# Patient Record
Sex: Male | Born: 1995 | Race: White | Hispanic: No | Marital: Single | Smoking: Never smoker
Health system: Southern US, Community
[De-identification: ages and names within clinical notes are randomized; demographics above are authoritative.]

## PROBLEM LIST (undated history)

## (undated) DIAGNOSIS — F909 Attention-deficit hyperactivity disorder, unspecified type: Secondary | ICD-10-CM

## (undated) HISTORY — PX: TONSILLECTOMY: SHX5217

## (undated) HISTORY — DX: Attention-deficit hyperactivity disorder, unspecified type: F90.9

---
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
## 2013-05-21 ENCOUNTER — Encounter: Payer: BC Managed Care – PPO | Admitting: Family Medicine

## 2013-06-10 ENCOUNTER — Encounter: Payer: Self-pay | Admitting: Family Medicine

## 2013-06-10 ENCOUNTER — Ambulatory Visit (INDEPENDENT_AMBULATORY_CARE_PROVIDER_SITE_OTHER): Payer: BC Managed Care – PPO | Admitting: Family Medicine

## 2013-06-10 VITALS — BP 92/64 | HR 59 | Temp 98.1°F | Ht 70.0 in | Wt 164.6 lb

## 2013-06-10 DIAGNOSIS — Z0000 Encounter for general adult medical examination without abnormal findings: Secondary | ICD-10-CM

## 2013-06-10 DIAGNOSIS — Z23 Encounter for immunization: Secondary | ICD-10-CM

## 2013-06-10 LAB — BASIC METABOLIC PANEL
BUN: 16 mg/dL (ref 6–23)
CO2: 30 mEq/L (ref 19–32)
Calcium: 9 mg/dL (ref 8.4–10.5)
Chloride: 104 mEq/L (ref 96–112)
Creatinine, Ser: 1.2 mg/dL (ref 0.4–1.5)
GFR: 84.4 mL/min (ref >60.00)
Glucose, Bld: 77 mg/dL (ref 70–99)
Potassium: 3.7 mEq/L (ref 3.5–5.1)
Sodium: 138 mEq/L (ref 135–145)

## 2013-06-10 LAB — LIPID PANEL
Cholesterol: 125 mg/dL (ref 0–200)
HDL: 57 mg/dL (ref >39.00)
LDL Cholesterol: 57 mg/dL (ref 0–99)
Total CHOL/HDL Ratio: 2
Triglycerides: 56 mg/dL (ref 0.0–149.0)
VLDL: 11.2 mg/dL (ref 0.0–40.0)

## 2013-06-10 LAB — CBC WITH DIFFERENTIAL/PLATELET
Basophils Absolute: 0 10*3/uL (ref 0.0–0.1)
Basophils Relative: 0.5 % (ref 0.0–3.0)
Eosinophils Absolute: 0.1 10*3/uL (ref 0.0–0.7)
Eosinophils Relative: 1.4 % (ref 0.0–5.0)
HCT: 44 % (ref 39.0–52.0)
Hemoglobin: 15.3 g/dL (ref 13.0–17.0)
Lymphocytes Relative: 40.2 % (ref 12.0–46.0)
Lymphs Abs: 1.8 10*3/uL (ref 0.7–4.0)
MCHC: 34.8 g/dL (ref 30.0–36.0)
MCV: 88.8 fl (ref 78.0–100.0)
Monocytes Absolute: 0.4 10*3/uL (ref 0.1–1.0)
Monocytes Relative: 9.3 % (ref 3.0–12.0)
Neutro Abs: 2.2 10*3/uL (ref 1.4–7.7)
Neutrophils Relative %: 48.6 % (ref 43.0–77.0)
Platelets: 230 10*3/uL (ref 150.0–400.0)
RBC: 4.96 Mil/uL (ref 4.22–5.81)
RDW: 12.4 % (ref 11.5–14.6)
WBC: 4.6 10*3/uL (ref 4.5–10.5)

## 2013-06-10 LAB — HEPATIC FUNCTION PANEL
ALT: 14 U/L (ref 0–53)
AST: 19 U/L (ref 0–37)
Albumin: 4.3 g/dL (ref 3.5–5.2)
Alkaline Phosphatase: 64 U/L (ref 39–117)
Bilirubin, Direct: 0.1 mg/dL (ref 0.0–0.3)
Total Bilirubin: 0.9 mg/dL (ref 0.3–1.2)
Total Protein: 6.6 g/dL (ref 6.0–8.3)

## 2013-06-10 LAB — TSH: TSH: 0.97 u[IU]/mL (ref 0.35–5.50)

## 2013-06-10 NOTE — Progress Notes (Signed)
Subjective:    Patient ID: Gregory Day, male    DOB: [birth year], 17 y.o.   MRN: [id number]  HPI Pt here for cpe and labs.   Review of Systems Review of Systems  Constitutional: Negative for activity change, appetite change and fatigue.  HENT: Negative for hearing loss, congestion, tinnitus and ear discharge.  dentist q32m Eyes: Negative for visual disturbance (see optho q1y -- vision corrected to 20/20 with glasses).  Respiratory: Negative for cough, chest tightness and shortness of breath.   Cardiovascular: Negative for chest pain, palpitations and leg swelling.  Gastrointestinal: Negative for abdominal pain, diarrhea, constipation and abdominal distention.  Genitourinary: Negative for urgency, frequency, decreased urine volume and difficulty urinating.  Musculoskeletal: Negative for back pain, arthralgias and gait problem.  Skin: Negative for color change, pallor and rash.  Neurological: Negative for dizziness, light-headedness, numbness and headaches.  Hematological: Negative for adenopathy. Does not bruise/bleed easily.  Psychiatric/Behavioral: Negative for suicidal ideas, confusion, sleep disturbance, self-injury, dysphoric mood, decreased concentration and agitation.     Past Medical History  Diagnosis Date  . ADHD (attention deficit hyperactivity disorder)    Family History  Problem Relation Age of Onset  . IgA nephropathy    . Hypertension Father   . Hyperlipidemia Son   . Hypertension Maternal Grandmother   . Heart disease Maternal Grandfather   . Depression Paternal Grandmother    History   Social History  . Marital Status: Single    Spouse Name: N/A    Number of Children: N/A  . Years of Education: N/A   Occupational History  . student    Social History Main Topics  . Smoking status: Never Smoker   . Smokeless tobacco: Not on file  . Alcohol Use: No  . Drug Use: No  . Sexual Activity: No   Other Topics Concern  . Not on file   Social History  Narrative  . No narrative on file   No current outpatient prescriptions on file prior to visit.   No current facility-administered medications on file prior to visit.       Objective:   Physical Exam BP 92/64  Pulse 59  Temp(Src) 98.1 F (36.7 C) (Oral)  Ht 5\' 10"  (1.778 m)  Wt 164 lb 9.6 oz (74.662 kg)  BMI 23.62 kg/m2  SpO2 96% General appearance: alert, cooperative, appears stated age and no distress Head: Normocephalic, without obvious abnormality, atraumatic Eyes: conjunctivae/corneas clear. PERRL, EOM's intact. Fundi benign. Ears: normal TM's and external ear canals both ears Nose: Nares normal. Septum midline. Mucosa normal. No drainage or sinus tenderness. Throat: lips, mucosa, and tongue normal; teeth and gums normal Neck: no adenopathy, no carotid bruit, no JVD, supple, symmetrical, trachea midline and thyroid not enlarged, symmetric, no tenderness/mass/nodules Back: symmetric, no curvature. ROM normal. No CVA tenderness. Lungs: clear to auscultation bilaterally Chest wall: no tenderness Heart: regular rate and rhythm, S1, S2 normal, no murmur, click, rub or gallop Abdomen: soft, non-tender; bowel sounds normal; no masses,  no organomegaly Male genitalia: penis: no lesions or discharge. testes: no masses or tenderness. no hernias Rectal: deferred Extremities: extremities normal, atraumatic, no cyanosis or edema Pulses: 2+ and symmetric Skin: Skin color, texture, turgor normal. No rashes or lesions Lymph nodes: Cervical, supraclavicular, and axillary nodes normal. Neurologic: Alert and oriented X 3, normal strength and tone. Normal symmetric reflexes. Normal coordination and gait Psych- no depression, no anxiety        Assessment & Plan:  cpe-- check labs  ghm utd     Flu shot given      See avs

## 2013-06-10 NOTE — Patient Instructions (Addendum)
Preventive Care for Adults, Male  A healthy lifestyle and preventive care can promote health and wellness. Preventive health guidelines for men include the following key practices:  · A routine yearly physical is a good way to check with your caregiver about your health and preventative screening. It is a chance to share any concerns and updates on your health, and to receive a thorough exam.  · Visit your dentist for a routine exam and preventative care every 6 months. Brush your teeth twice a day and floss once a day. Good oral hygiene prevents tooth decay and gum disease.  · The frequency of eye exams is based on your age, health, family medical history, use of contact lenses, and other factors. Follow your caregiver's recommendations for frequency of eye exams.  · Eat a healthy diet. Foods like vegetables, fruits, whole grains, low-fat dairy products, and lean protein foods contain the nutrients you need without too many calories. Decrease your intake of foods high in solid fats, added sugars, and salt. Eat the right amount of calories for you. Get information about a proper diet from your caregiver, if necessary.  · Regular physical exercise is one of the most important things you can do for your health. Most adults should get at least 150 minutes of moderate-intensity exercise (any activity that increases your heart rate and causes you to sweat) each week. In addition, most adults need muscle-strengthening exercises on 2 or more days a week.  · Maintain a healthy weight. The body mass index (BMI) is a screening tool to identify possible weight problems. It provides an estimate of body fat based on height and weight. Your caregiver can help determine your BMI, and can help you achieve or maintain a healthy weight. For adults 20 years and older:  · A BMI below 18.5 is considered underweight.  · A BMI of 18.5 to 24.9 is normal.  · A BMI of 25 to 29.9 is considered overweight.  · A BMI of 30 and above is  considered obese.  · Maintain normal blood lipids and cholesterol levels by exercising and minimizing your intake of saturated fat. Eat a balanced diet with plenty of fruit and vegetables. Blood tests for lipids and cholesterol should begin at age 20 and be repeated every 5 years. If your lipid or cholesterol levels are high, you are over 50, or you are a high risk for heart disease, you may need your cholesterol levels checked more frequently. Ongoing high lipid and cholesterol levels should be treated with medicines if diet and exercise are not effective.  · If you smoke, find out from your caregiver how to quit. If you do not use tobacco, do not start.  · If you choose to drink alcohol, do not exceed 2 drinks per day. One drink is considered to be 12 ounces (355 mL) of beer, 5 ounces (148 mL) of wine, or 1.5 ounces (44 mL) of liquor.  · Avoid use of street drugs. Do not share needles with anyone. Ask for help if you need support or instructions about stopping the use of drugs.  · High blood pressure causes heart disease and increases the risk of stroke. Your blood pressure should be checked at least every 1 to 2 years. Ongoing high blood pressure should be treated with medicines, if weight loss and exercise are not effective.  · If you are 45 to 19 years old, ask your caregiver if you should take aspirin to prevent heart disease.  · Diabetes screening involves taking   a blood sample to check your fasting blood sugar level. This should be done once every 3 years, after age 45, if you are within normal weight and without risk factors for diabetes. Testing should be considered at a younger age or be carried out more frequently if you are overweight and have at least 1 risk factor for diabetes.  · Colorectal cancer can be detected and often prevented. Most routine colorectal cancer screening begins at the age of 50 and continues through age 75. However, your caregiver may recommend screening at an earlier age if you  have risk factors for colon cancer. On a yearly basis, your caregiver may provide home test kits to check for hidden blood in the stool. Use of a small camera at the end of a tube, to directly examine the colon (sigmoidoscopy or colonoscopy), can detect the earliest forms of colorectal cancer. Talk to your caregiver about this at age 50, when routine screening begins.  Direct examination of the colon should be repeated every 5 to 10 years through age 75, unless early forms of pre-cancerous polyps or small growths are found.  · Hepatitis C blood testing is recommended for all people born from 1945 through 1965 and any individual with known risks for hepatitis C.  · Practice safe sex. Use condoms and avoid high-risk sexual practices to reduce the spread of sexually transmitted infections (STIs). STIs include gonorrhea, chlamydia, syphilis, trichomonas, herpes, HPV, and human immunodeficiency virus (HIV). Herpes, HIV, and HPV are viral illnesses that have no cure. They can result in disability, cancer, and death.  · A one-time screening for abdominal aortic aneurysm (AAA) and surgical repair of large AAAs by sound wave imaging (ultrasonography) is recommended for ages 65 to 75 years who are current or former smokers.  · Healthy men should no longer receive prostate-specific antigen (PSA) blood tests as part of routine cancer screening. Consult with your caregiver about prostate cancer screening.  · Testicular cancer screening is not recommended for adult males who have no symptoms. Screening includes self-exam, caregiver exam, and other screening tests. Consult with your caregiver about any symptoms you have or any concerns you have about testicular cancer.  · Use sunscreen with skin protection factor (SPF) of 30 or more. Apply sunscreen liberally and repeatedly throughout the day. You should seek shade when your shadow is shorter than you. Protect yourself by wearing long sleeves, pants, a wide-brimmed hat, and  sunglasses year round, whenever you are outdoors.  · Once a month, do a whole body skin exam, using a mirror to look at the skin on your back. Notify your caregiver of new moles, moles that have irregular borders, moles that are larger than a pencil eraser, or moles that have changed in shape or color.  · Stay current with required immunizations.  · Influenza. You need a dose every fall (or winter). The composition of the flu vaccine changes each year, so being vaccinated once is not enough.  · Pneumococcal polysaccharide. You need 1 to 2 doses if you smoke cigarettes or if you have certain chronic medical conditions. You need 1 dose at age 65 (or older) if you have never been vaccinated.  · Tetanus, diphtheria, pertussis (Tdap, Td). Get 1 dose of Tdap vaccine if you are younger than age 65 years, are over 65 and have contact with an infant, are a healthcare worker, or simply want to be protected from whooping cough. After that, you need a Td booster dose every 10 years. Consult your   caregiver if you have not had at least 3 tetanus and diphtheria-containing shots sometime in your life or have a deep or dirty wound.  · HPV. This vaccine is recommended for males 13 through 19 years of age. This vaccine may be given to men 22 through 19 years of age who have not completed the 3 dose series. It is recommended for men through age 26 who have sex with men or whose immune system is weakened because of HIV infection, other illness, or medications. The vaccine is given in 3 doses over 6 months.  · Measles, mumps, rubella (MMR). You need at least 1 dose of MMR if you were born in 1957 or later. You may also need a 2nd dose.  · Meningococcal. If you are age 19 to 21 years and a first-year college student living in a residence hall, or have one of several medical conditions, you need to get vaccinated against meningococcal disease. You may also need additional booster doses.  · Zoster (shingles). If you are age 60 years or  older, you should get this vaccine.  · Varicella (chickenpox). If you have never had chickenpox or you were vaccinated but received only 1 dose, talk to your caregiver to find out if you need this vaccine.  · Hepatitis A. You need this vaccine if you have a specific risk factor for hepatitis A virus infection, or you simply wish to be protected from this disease. The vaccine is usually given as 2 doses, 6 to 18 months apart.  · Hepatitis B. You need this vaccine if you have a specific risk factor for hepatitis B virus infection or you simply wish to be protected from this disease. The vaccine is given in 3 doses, usually over 6 months.  Preventative Service / Frequency  Ages 19 to 39  · Blood pressure check.** / Every 1 to 2 years.  · Lipid and cholesterol check.** / Every 5 years beginning at age 20.  · Hepatitis C blood test.** / For any individual with known risks for hepatitis C.  · Skin self-exam. / Monthly.  · Influenza immunization.** / Every year.  · Pneumococcal polysaccharide immunization.** / 1 to 2 doses if you smoke cigarettes or if you have certain chronic medical conditions.  · Tetanus, diphtheria, pertussis (Tdap,Td) immunization. / A one-time dose of Tdap vaccine. After that, you need a Td booster dose every 10 years.  · HPV immunization. / 3 doses over 6 months, if 26 and younger.  · Measles, mumps, rubella (MMR) immunization. / You need at least 1 dose of MMR if you were born in 1957 or later. You may also need a 2nd dose.  · Meningococcal immunization. / 1 dose if you are age 19 to 21 years and a first-year college student living in a residence hall, or have one of several medical conditions, you need to get vaccinated against meningococcal disease. You may also need additional booster doses.  · Varicella immunization.** / Consult your caregiver.  · Hepatitis A immunization.** / Consult your caregiver. 2 doses, 6 to 18 months apart.  · Hepatitis B immunization.** / Consult your caregiver. 3 doses  usually over 6 months.  Ages 40 to 64  · Blood pressure check.** / Every 1 to 2 years.  · Lipid and cholesterol check.** / Every 5 years beginning at age 20.  · Fecal occult blood test (FOBT) of stool. / Every year beginning at age 50 and continuing until age 75. You may not have   to do this test if you get colonoscopy every 10 years.  · Flexible sigmoidoscopy** or colonoscopy.** / Every 5 years for a flexible sigmoidoscopy or every 10 years for a colonoscopy beginning at age 50 and continuing until age 75.  · Hepatitis C blood test.** / For all people born from 1945 through 1965 and any individual with known risks for hepatitis C.  · Skin self-exam. / Monthly.  · Influenza immunization.** / Every year.  · Pneumococcal polysaccharide immunization.** / 1 to 2 doses if you smoke cigarettes or if you have certain chronic medical conditions.  · Tetanus, diphtheria, pertussis (Tdap/Td) immunization.** / A one-time dose of Tdap vaccine. After that, you need a Td booster dose every 10 years.  · Measles, mumps, rubella (MMR) immunization.  / You need at least 1 dose of MMR if you were born in 1957 or later. You may also need a 2nd dose.  · Varicella immunization.**/ Consult your caregiver.  · Meningococcal immunization.** / Consult your caregiver.  · Hepatitis A immunization.** / Consult your caregiver. 2 doses, 6 to 18 months apart.  · Hepatitis B immunization.** / Consult your caregiver. 3 doses, usually over 6 months.  Ages 65 and over  · Blood pressure check.** / Every 1 to 2 years.  · Lipid and cholesterol check.**/ Every 5 years beginning at age 20.  · Fecal occult blood test (FOBT) of stool. / Every year beginning at age 50 and continuing until age 75. You may not have to do this test if you get colonoscopy every 10 years.  · Flexible sigmoidoscopy** or colonoscopy.** / Every 5 years for a flexible sigmoidoscopy or every 10 years for a colonoscopy beginning at age 50 and continuing until age 75.  · Hepatitis C blood  test.** / For all people born from 1945 through 1965 and any individual with known risks for hepatitis C.  · Abdominal aortic aneurysm (AAA) screening.** / A one-time screening for ages 65 to 75 years who are current or former smokers.  · Skin self-exam. / Monthly.  · Influenza immunization.** / Every year.  · Pneumococcal polysaccharide immunization.** / 1 dose at age 65 (or older) if you have never been vaccinated.  · Tetanus, diphtheria, pertussis (Tdap, Td) immunization. / A one-time dose of Tdap vaccine if you are over 65 and have contact with an infant, are a healthcare worker, or simply want to be protected from whooping cough. After that, you need a Td booster dose every 10 years.  · Varicella immunization. ** / Consult your caregiver.  · Meningococcal immunization.** / Consult your caregiver.  · Hepatitis A immunization. ** / Consult your caregiver. 2 doses, 6 to 18 months apart.  · Hepatitis B immunization.** / Check with your caregiver. 3 doses, usually over 6 months.  **Family history and personal history of risk and conditions may change your caregiver's recommendations.  Document Released: 11/07/2001 Document Revised: 12/04/2011 Document Reviewed: 02/06/2011  [employer]® Patient Information ©2014 [employer].

## 2013-06-11 LAB — POCT URINALYSIS DIPSTICK
Bilirubin, UA: NEGATIVE
Blood, UA: NEGATIVE
Glucose, UA: NEGATIVE
Ketones, UA: NEGATIVE
Leukocytes, UA: NEGATIVE
Nitrite, UA: NEGATIVE
Protein, UA: NEGATIVE
Spec Grav, UA: <=1.005
Urobilinogen, UA: 0.2
pH, UA: 8

## 2013-09-30 ENCOUNTER — Encounter: Payer: Self-pay | Admitting: Family Medicine

## 2013-09-30 ENCOUNTER — Ambulatory Visit (INDEPENDENT_AMBULATORY_CARE_PROVIDER_SITE_OTHER): Payer: BC Managed Care – PPO | Admitting: Family Medicine

## 2013-09-30 VITALS — BP 134/62 | HR 84 | Temp 98.1°F | Ht 71.0 in | Wt 169.0 lb

## 2013-09-30 DIAGNOSIS — J329 Chronic sinusitis, unspecified: Secondary | ICD-10-CM

## 2013-09-30 MED ORDER — FLUTICASONE PROPIONATE 50 MCG/ACT NA SUSP: 2.0000 | Freq: Every day | NASAL | Status: DC

## 2013-09-30 MED ORDER — CEFUROXIME AXETIL 500 MG PO TABS
500.0000 mg | ORAL_TABLET | Freq: Two times a day (BID) | ORAL | Status: AC
Start: 2013-09-30 — End: 2013-10-10

## 2013-09-30 NOTE — Progress Notes (Signed)
  Subjective:     Gregory Day is a 18 y.o. male who presents for evaluation of sinus pain. Symptoms include: congestion, facial pain, headaches, nasal congestion and sinus pressure. Onset of symptoms was 1 week ago. Symptoms have been gradually worsening since that time. Past history is significant for no history of pneumonia or bronchitis. Patient is a non-smoker.  The following portions of the patient's history were reviewed and updated as appropriate: allergies, current medications, past family history, past medical history, past social history, past surgical history and problem list.  Review of Systems Pertinent items are noted in HPI.   Objective:    BP 134/62  Pulse 84  Temp(Src) 98.1 F (36.7 C) (Oral)  Ht 5\' 11"  (1.803 m)  Wt 169 lb (76.658 kg)  BMI 23.58 kg/m2  SpO2 97% General appearance: alert, cooperative, appears stated age and no distress Ears: normal TM's and external ear canals both ears Nose: Nares normal. Septum midline. Mucosa normal. No drainage or sinus tenderness., green discharge, moderate congestion, turbinates red, swollen, sinus tenderness bilateral Throat: lips, mucosa, and tongue normal; teeth and gums normal Neck: moderate anterior cervical adenopathy, supple, symmetrical, trachea midline and thyroid not enlarged, symmetric, no tenderness/mass/nodules Lungs: clear to auscultation bilaterally Heart: S1, S2 normal    Assessment:    Acute bacterial sinusitis.    Plan:    Nasal steroids per medication orders. Antihistamines per medication orders. Ceftin per medication orders.

## 2013-09-30 NOTE — Progress Notes (Signed)
Pre visit review using our clinic review tool, if applicable. No additional management support is needed unless otherwise documented below in the visit note. 

## 2013-09-30 NOTE — Patient Instructions (Signed)

## 2014-03-20 ENCOUNTER — Encounter (HOSPITAL_BASED_OUTPATIENT_CLINIC_OR_DEPARTMENT_OTHER): Payer: Self-pay | Admitting: Emergency Medicine

## 2014-03-20 ENCOUNTER — Emergency Department (HOSPITAL_BASED_OUTPATIENT_CLINIC_OR_DEPARTMENT_OTHER)
Admission: EM | Admit: 2014-03-20 | Discharge: 2014-03-21 | Disposition: A | Discharge status: Home / Self Care | Payer: BC Managed Care – PPO | Attending: Emergency Medicine | Admitting: Emergency Medicine

## 2014-03-20 DIAGNOSIS — L738 Other specified follicular disorders: Secondary | ICD-10-CM | POA: Insufficient documentation

## 2014-03-20 DIAGNOSIS — IMO0002 Reserved for concepts with insufficient information to code with codable children: Secondary | ICD-10-CM | POA: Insufficient documentation

## 2014-03-20 DIAGNOSIS — R509 Fever, unspecified: Secondary | ICD-10-CM | POA: Insufficient documentation

## 2014-03-20 DIAGNOSIS — Z8659 Personal history of other mental and behavioral disorders: Secondary | ICD-10-CM | POA: Insufficient documentation

## 2014-03-20 DIAGNOSIS — J029 Acute pharyngitis, unspecified: Secondary | ICD-10-CM

## 2014-03-20 LAB — RAPID STREP SCREEN (MED CTR MEBANE ONLY): Streptococcus, Group A Screen (Direct): NEGATIVE

## 2014-03-20 NOTE — ED Notes (Signed)
Pt reports temperature this am, with sore throat, took tylenol and felt better then this afternoon he developed a small rash to left arm

## 2014-03-20 NOTE — ED Notes (Signed)
MD at bedside. 

## 2014-03-20 NOTE — ED Provider Notes (Signed)
CSN: [id number]     Arrival date & time 03/20/14  2314 History   This chart was scribed for April Smitty CordsK Palumbo-Rasch, MD by Nicholos Johnsenise Iheanachor, ED scribe. This patient was seen in room MH10/MH10 and the patient's care was started at 11:47 PM.    Chief Complaint  Patient presents with  . Rash  . Sore Throat   Patient is a 18 y.o. male presenting with rash and pharyngitis. The history is provided by the patient. No language interpreter was used.  Rash Location:  Shoulder/arm, leg and torso Quality: itchiness   Severity:  Mild Onset quality:  Gradual Timing:  Constant Progression:  Resolved Chronicity:  New Context: food   Context: not insect bite/sting, not new detergent/soap and not sick contacts   Relieved by:  Nothing Worsened by:  Nothing tried Ineffective treatments:  None tried Associated symptoms: fever and sore throat   Associated symptoms: no abdominal pain, no diarrhea and not vomiting   Fever:    Timing:  Intermittent   Max temp PTA (F):  102   Progression:  Improving Sore throat:    Severity:  Mild   Onset quality:  Gradual   Timing:  Constant   Progression:  Unchanged Sore Throat Pertinent negatives include no abdominal pain.   HPI Comments: Gregory Day is a 18 y.o. male who presents to the Emergency Department complaining of sore throat; onset this morning. States he woke up with a fever of 102 and HA. Took ibuprofen this morning. Took Benadryl this evening. Reports tonight he broke out in a diffuse rash all over his body; gradually resolving. Does report drinking a red bull for the first time today. Reports no known insect bites. No reports of poison ivy. States he had some abdominal pain last night; has since resolved. No new soaps, lotions, detergent.  Past Medical History  Diagnosis Date  . ADHD (attention deficit hyperactivity disorder)    Past Surgical History  Procedure Laterality Date  . Tonsillectomy     Family History  Problem Relation Age of Onset   . IgA nephropathy    . Hypertension Father   . Hyperlipidemia Son   . Hypertension Maternal Grandmother   . Heart disease Maternal Grandfather   . Depression Paternal Grandmother    History  Substance Use Topics  . Smoking status: Never Smoker   . Smokeless tobacco: Not on file  . Alcohol Use: No    Review of Systems  Constitutional: Positive for fever.  HENT: Positive for sore throat. Negative for drooling, ear pain, facial swelling, trouble swallowing and voice change.   Gastrointestinal: Negative for vomiting, abdominal pain, diarrhea and constipation.  Skin: Positive for rash.  All other systems reviewed and are negative.  Allergies  Review of patient's allergies indicates no known allergies.  Home Medications   Prior to Admission medications   Medication Sig Start Date End Date Taking? Authorizing Provider  acetaminophen (TYLENOL) 325 MG tablet Take 650 mg by mouth every 6 (six) hours as needed.   Yes Historical Provider, MD  diphenhydrAMINE (SOMINEX) 25 MG tablet Take 25 mg by mouth at bedtime as needed for sleep.   Yes Historical Provider, MD  fluticasone (FLONASE) 50 MCG/ACT nasal spray Place 2 sprays into both nostrils daily. 09/30/13   Lelon PerlaYvonne R Lowne, DO   Triage vitals: BP 124/67  Pulse 101  Temp(Src) 99.4 F (37.4 C) (Oral)  Resp 18  Ht 5\' 11"  (1.803 m)  Wt 165 lb (74.844 kg)  BMI 23.02  kg/m2  SpO2 100%  Physical Exam  Nursing note and vitals reviewed. Constitutional: He is oriented to person, place, and time. He appears well-developed and well-nourished.  HENT:  Head: Normocephalic and atraumatic.  Right Ear: External ear normal.  Left Ear: External ear normal.  Mouth/Throat: Oropharynx is clear and moist.  Mallampati class 1. No coating of the tongue. Uvula midline.   Eyes: Conjunctivae and EOM are normal. Pupils are equal, round, and reactive to light.  Neck: Normal range of motion. Neck supple. No tracheal deviation present.  No pain with  displacement of the trachea,  Normal phonation  Cardiovascular: Normal rate, regular rhythm, normal heart sounds and intact distal pulses.   Pulmonary/Chest: Effort normal and breath sounds normal. No stridor. No respiratory distress. He has no wheezes. He has no rales.  Abdominal: Soft. Bowel sounds are normal. He exhibits no distension. There is no tenderness. There is no rebound and no guarding.  Musculoskeletal: Normal range of motion.  Lymphadenopathy:    He has no cervical adenopathy.  Neurological: He is alert and oriented to person, place, and time.  Skin: Skin is warm and dry.  Inflamed hair follicles.  Psychiatric: He has a normal mood and affect. Judgment normal.    ED Course  Procedures (including critical care time) DIAGNOSTIC STUDIES:   COORDINATION OF CARE: At 11:51 PM: Discussed treatment plan with patient which includes strep test and ibuprofen. Patient agrees.    Labs Review Labs Reviewed  RAPID STREP SCREEN   Imaging Review No results found.   EKG Interpretation None      MDM   Final diagnoses:  None   Rash no longer present suspect the energy drink as source.  Continue benadryl as needed for itching.  Viral pharyngitis will treat with ibuprofen   I personally performed the services described in this documentation, which was scribed in my presence. The recorded information has been reviewed and is accurate.     Jasmine AweApril K Palumbo-Rasch, MD 03/21/14 [phone]

## 2014-03-21 ENCOUNTER — Encounter (HOSPITAL_BASED_OUTPATIENT_CLINIC_OR_DEPARTMENT_OTHER): Payer: Self-pay | Admitting: Emergency Medicine

## 2014-03-21 LAB — MONONUCLEOSIS SCREEN: Mono Screen: NEGATIVE

## 2014-03-21 MED ORDER — IBUPROFEN 400 MG PO TABS: 400.0000 mg | ORAL_TABLET | Freq: Four times a day (QID) | ORAL | Status: DC | PRN

## 2014-03-21 MED ORDER — IBUPROFEN 400 MG PO TABS
400.0000 mg | ORAL_TABLET | Freq: Once | ORAL | Status: AC
  Administered 2014-03-21: 400 mg via ORAL
  Filled 2014-03-21: qty 1

## 2014-03-21 NOTE — ED Notes (Signed)
MD at bedside discussing dispo plan of care. 

## 2014-03-23 LAB — CULTURE, GROUP A STREP

## 2014-06-12 ENCOUNTER — Ambulatory Visit (INDEPENDENT_AMBULATORY_CARE_PROVIDER_SITE_OTHER): Payer: BC Managed Care – PPO | Admitting: Family Medicine

## 2014-06-12 ENCOUNTER — Encounter: Payer: Self-pay | Admitting: Family Medicine

## 2014-06-12 VITALS — BP 122/84 | HR 59 | Temp 97.9°F | Ht 70.0 in | Wt 168.8 lb

## 2014-06-12 DIAGNOSIS — R61 Generalized hyperhidrosis: Secondary | ICD-10-CM

## 2014-06-12 DIAGNOSIS — Z0000 Encounter for general adult medical examination without abnormal findings: Secondary | ICD-10-CM

## 2014-06-12 DIAGNOSIS — Z23 Encounter for immunization: Secondary | ICD-10-CM

## 2014-06-12 DIAGNOSIS — M25561 Pain in right knee: Secondary | ICD-10-CM

## 2014-06-12 DIAGNOSIS — L509 Urticaria, unspecified: Secondary | ICD-10-CM

## 2014-06-12 DIAGNOSIS — M25569 Pain in unspecified knee: Secondary | ICD-10-CM

## 2014-06-12 LAB — CBC WITH DIFFERENTIAL/PLATELET
Basophils Absolute: 0 10*3/uL (ref 0.0–0.1)
Basophils Relative: 0.4 % (ref 0.0–3.0)
EOS PCT: 1.9 % (ref 0.0–5.0)
Eosinophils Absolute: 0.1 10*3/uL (ref 0.0–0.7)
HCT: 45 % (ref 39.0–52.0)
Hemoglobin: 15.3 g/dL (ref 13.0–17.0)
LYMPHS PCT: 35.2 % (ref 12.0–46.0)
Lymphs Abs: 1.9 10*3/uL (ref 0.7–4.0)
MCHC: 34 g/dL (ref 30.0–36.0)
MCV: 90.5 fl (ref 78.0–100.0)
Monocytes Absolute: 0.5 10*3/uL (ref 0.1–1.0)
Monocytes Relative: 8.8 % (ref 3.0–12.0)
NEUTROS PCT: 53.7 % (ref 43.0–77.0)
Neutro Abs: 2.8 10*3/uL (ref 1.4–7.7)
Platelets: 232 10*3/uL (ref 150.0–400.0)
RBC: 4.97 Mil/uL (ref 4.22–5.81)
RDW: 12.2 % (ref 11.5–14.6)
WBC: 5.3 10*3/uL (ref 4.5–10.5)

## 2014-06-12 LAB — BASIC METABOLIC PANEL
BUN: 18 mg/dL (ref 6–23)
CO2: 28 mEq/L (ref 19–32)
Calcium: 9 mg/dL (ref 8.4–10.5)
Chloride: 104 mEq/L (ref 96–112)
Creatinine, Ser: 1.3 mg/dL (ref 0.4–1.5)
GFR: 78.15 mL/min (ref >60.00)
Glucose, Bld: 86 mg/dL (ref 70–99)
Potassium: 3.8 mEq/L (ref 3.5–5.1)
Sodium: 139 mEq/L (ref 135–145)

## 2014-06-12 LAB — HEPATIC FUNCTION PANEL
ALT: 22 U/L (ref 0–53)
AST: 24 U/L (ref 0–37)
Albumin: 4.3 g/dL (ref 3.5–5.2)
Alkaline Phosphatase: 60 U/L (ref 39–117)
BILIRUBIN DIRECT: 0.1 mg/dL (ref 0.0–0.3)
BILIRUBIN TOTAL: 1.1 mg/dL (ref 0.2–1.2)
Total Protein: 7.2 g/dL (ref 6.0–8.3)

## 2014-06-12 LAB — POCT URINALYSIS DIPSTICK
Bilirubin, UA: NEGATIVE
Blood, UA: NEGATIVE
Glucose, UA: NEGATIVE
Ketones, UA: NEGATIVE
LEUKOCYTES UA: NEGATIVE
Nitrite, UA: NEGATIVE
PH UA: 8
SPEC GRAV UA: 1.015
Urobilinogen, UA: 2

## 2014-06-12 LAB — LIPID PANEL
CHOL/HDL RATIO: 3
Cholesterol: 146 mg/dL (ref 0–200)
HDL: 58 mg/dL (ref >39.00)
LDL Cholesterol: 79 mg/dL (ref 0–99)
NonHDL: 88
Triglycerides: 44 mg/dL (ref 0.0–149.0)
VLDL: 8.8 mg/dL (ref 0.0–40.0)

## 2014-06-12 LAB — TSH: TSH: 0.84 u[IU]/mL (ref 0.35–5.50)

## 2014-06-12 MED ORDER — ALUMINUM CHLORIDE 20 % EX SOLN: Freq: Every day | CUTANEOUS | Status: DC

## 2014-06-12 NOTE — Progress Notes (Signed)
Subjective:    Patient ID: Gregory Day, male    DOB: 1995/11/21, 18 y.o.   MRN: [id number]  HPI Pt here with his mom for cpe-- he is in school and enjoys the culinary part of his studies.  Pt c/o knee pain--no injury He also c/o allergy symptoms and spontaneous hives.  No known cause.   Review of Systems  Constitutional: Negative.   HENT: Negative for congestion, ear pain, hearing loss, nosebleeds, postnasal drip, rhinorrhea, sinus pressure, sneezing and tinnitus.   Eyes: Negative for photophobia, discharge, itching and visual disturbance.  Respiratory: Negative.   Cardiovascular: Negative.   Gastrointestinal: Negative for abdominal pain, constipation, blood in stool, abdominal distention and anal bleeding.  Endocrine: Negative.   Genitourinary: Negative.   Musculoskeletal: Negative.   Skin: Negative.   Allergic/Immunologic: Negative.   Neurological: Negative for dizziness, weakness, light-headedness, numbness and headaches.  Psychiatric/Behavioral: Negative for suicidal ideas, confusion, sleep disturbance, dysphoric mood, decreased concentration and agitation. The patient is not nervous/anxious.    Past Medical History  Diagnosis Date  . ADHD (attention deficit hyperactivity disorder)    History   Social History  . Marital Status: Single    Spouse Name: N/A    Number of Children: N/A  . Years of Education: N/A   Occupational History  . student    Social History Main Topics  . Smoking status: Never Smoker   . Smokeless tobacco: Not on file  . Alcohol Use: No  . Drug Use: No  . Sexual Activity: No   Other Topics Concern  . Not on file   Social History Narrative  . No narrative on file   Current Outpatient Prescriptions  Medication Sig Dispense Refill  . aluminum chloride (DRYSOL) 20 % external solution Apply topically at bedtime.  35 mL  0   No current facility-administered medications for this visit.   No Known Allergies      Objective:   Physical  Exam  Nursing note and vitals reviewed. Constitutional: He is oriented to person, place, and time. He appears well-developed and well-nourished. No distress.  HENT:  Head: Normocephalic and atraumatic.  Right Ear: External ear normal.  Left Ear: External ear normal.  Nose: Nose normal.  Mouth/Throat: Oropharynx is clear and moist. No oropharyngeal exudate.  Eyes: Conjunctivae and EOM are normal. Pupils are equal, round, and reactive to light. Right eye exhibits no discharge. Left eye exhibits no discharge.  Neck: Normal range of motion. Neck supple. No JVD present. No thyromegaly present.  Cardiovascular: Normal rate, regular rhythm and intact distal pulses.  Exam reveals no gallop and no friction rub.   No murmur heard. Pulmonary/Chest: Effort normal and breath sounds normal. No respiratory distress. He has no wheezes. He has no rales. He exhibits no tenderness.  Abdominal: Soft. Bowel sounds are normal. He exhibits no distension and no mass. There is no tenderness. There is no rebound and no guarding.  Genitourinary: Penis normal.  Musculoskeletal: Normal range of motion. He exhibits no edema and no tenderness.  Lymphadenopathy:    He has no cervical adenopathy.  Neurological: He is alert and oriented to person, place, and time. He displays normal reflexes. He exhibits normal muscle tone.  Skin: Skin is warm and dry. No rash noted. He is not diaphoretic. No erythema. No pallor.  Psychiatric: He has a normal mood and affect. His behavior is normal. Judgment and thought content normal.          Assessment & Plan:  1.  Need for prophylactic vaccination and inoculation against influenza   - Flu Vaccine QUAD 36+ mos PF IM (Fluarix Quad PF)  2. Right knee pain   - Ambulatory referral to Sports Medicine  3. Hives Zyrtec 10 mg qd   - Ambulatory referral to Allergy  4. Excessive sweating   - aluminum chloride (DRYSOL) 20 % external solution; Apply topically at bedtime.   Dispense: 35 mL; Refill: 0  5. Preventative health care  ghm utd Check labs See AVS - Basic metabolic panel - CBC with Differential - Hepatic function panel - Lipid panel - POCT urinalysis dipstick - TSH

## 2014-06-12 NOTE — Patient Instructions (Signed)
Preventive Care for Adults A healthy lifestyle and preventive care can promote health and wellness. Preventive health guidelines for men include the following key practices:  A routine yearly physical is a good way to check with your health care provider about your health and preventative screening. It is a chance to share any concerns and updates on your health and to receive a thorough exam.  Visit your dentist for a routine exam and preventative care every 6 months. Brush your teeth twice a day and floss once a day. Good oral hygiene prevents tooth decay and gum disease.  The frequency of eye exams is based on your age, health, family medical history, use of contact lenses, and other factors. Follow your health care provider's recommendations for frequency of eye exams.  Eat a healthy diet. Foods such as vegetables, fruits, whole grains, low-fat dairy products, and lean protein foods contain the nutrients you need without too many calories. Decrease your intake of foods high in solid fats, added sugars, and salt. Eat the right amount of calories for you.Get information about a proper diet from your health care provider, if necessary.  Regular physical exercise is one of the most important things you can do for your health. Most adults should get at least 150 minutes of moderate-intensity exercise (any activity that increases your heart rate and causes you to sweat) each week. In addition, most adults need muscle-strengthening exercises on 2 or more days a week.  Maintain a healthy weight. The body mass index (BMI) is a screening tool to identify possible weight problems. It provides an estimate of body fat based on height and weight. Your health care provider can find your BMI and can help you achieve or maintain a healthy weight.For adults 20 years and older:  A BMI below 18.5 is considered underweight.  A BMI of 18.5 to 24.9 is normal.  A BMI of 25 to 29.9 is considered overweight.  A BMI  of 30 and above is considered obese.  Maintain normal blood lipids and cholesterol levels by exercising and minimizing your intake of saturated fat. Eat a balanced diet with plenty of fruit and vegetables. Blood tests for lipids and cholesterol should begin at age 50 and be repeated every 5 years. If your lipid or cholesterol levels are high, you are over 50, or you are at high risk for heart disease, you may need your cholesterol levels checked more frequently.Ongoing high lipid and cholesterol levels should be treated with medicines if diet and exercise are not working.  If you smoke, find out from your health care provider how to quit. If you do not use tobacco, do not start.  Lung cancer screening is recommended for adults aged 73-80 years who are at high risk for developing lung cancer because of a history of smoking. A yearly low-dose CT scan of the lungs is recommended for people who have at least a 30-pack-year history of smoking and are a current smoker or have quit within the past 15 years. A pack year of smoking is smoking an average of 1 pack of cigarettes a day for 1 year (for example: 1 pack a day for 30 years or 2 packs a day for 15 years). Yearly screening should continue until the smoker has stopped smoking for at least 15 years. Yearly screening should be stopped for people who develop a health problem that would prevent them from having lung cancer treatment.  If you choose to drink alcohol, do not have more than  2 drinks per day. One drink is considered to be 12 ounces (355 mL) of beer, 5 ounces (148 mL) of wine, or 1.5 ounces (44 mL) of liquor.  Avoid use of street drugs. Do not share needles with anyone. Ask for help if you need support or instructions about stopping the use of drugs.  High blood pressure causes heart disease and increases the risk of stroke. Your blood pressure should be checked at least every 1-2 years. Ongoing high blood pressure should be treated with  medicines, if weight loss and exercise are not effective.  If you are 45-79 years old, ask your health care provider if you should take aspirin to prevent heart disease.  Diabetes screening involves taking a blood sample to check your fasting blood sugar level. This should be done once every 3 years, after age 45, if you are within normal weight and without risk factors for diabetes. Testing should be considered at a younger age or be carried out more frequently if you are overweight and have at least 1 risk factor for diabetes.  Colorectal cancer can be detected and often prevented. Most routine colorectal cancer screening begins at the age of 50 and continues through age 75. However, your health care provider may recommend screening at an earlier age if you have risk factors for colon cancer. On a yearly basis, your health care provider may provide home test kits to check for hidden blood in the stool. Use of a small camera at the end of a tube to directly examine the colon (sigmoidoscopy or colonoscopy) can detect the earliest forms of colorectal cancer. Talk to your health care provider about this at age 50, when routine screening begins. Direct exam of the colon should be repeated every 5-10 years through age 75, unless early forms of precancerous polyps or small growths are found.  People who are at an increased risk for hepatitis B should be screened for this virus. You are considered at high risk for hepatitis B if:  You were born in a country where hepatitis B occurs often. Talk with your health care provider about which countries are considered high risk.  Your parents were born in a high-risk country and you have not received a shot to protect against hepatitis B (hepatitis B vaccine).  You have HIV or AIDS.  You use needles to inject street drugs.  You live with, or have sex with, someone who has hepatitis B.  You are a man who has sex with other men (MSM).  You get hemodialysis  treatment.  You take certain medicines for conditions such as cancer, organ transplantation, and autoimmune conditions.  Hepatitis C blood testing is recommended for all people born from 1945 through 1965 and any individual with known risks for hepatitis C.  Practice safe sex. Use condoms and avoid high-risk sexual practices to reduce the spread of sexually transmitted infections (STIs). STIs include gonorrhea, chlamydia, syphilis, trichomonas, herpes, HPV, and human immunodeficiency virus (HIV). Herpes, HIV, and HPV are viral illnesses that have no cure. They can result in disability, cancer, and death.  If you are at risk of being infected with HIV, it is recommended that you take a prescription medicine daily to prevent HIV infection. This is called preexposure prophylaxis (PrEP). You are considered at risk if:  You are a man who has sex with other men (MSM) and have other risk factors.  You are a heterosexual man, are sexually active, and are at increased risk for HIV infection.    You take drugs by injection.  You are sexually active with a partner who has HIV.  Talk with your health care provider about whether you are at high risk of being infected with HIV. If you choose to begin PrEP, you should first be tested for HIV. You should then be tested every 3 months for as long as you are taking PrEP.  A one-time screening for abdominal aortic aneurysm (AAA) and surgical repair of large AAAs by ultrasound are recommended for men ages 32 to 67 years who are current or former smokers.  Healthy men should no longer receive prostate-specific antigen (PSA) blood tests as part of routine cancer screening. Talk with your health care provider about prostate cancer screening.  Testicular cancer screening is not recommended for adult males who have no symptoms. Screening includes self-exam, a health care provider exam, and other screening tests. Consult with your health care provider about any symptoms  you have or any concerns you have about testicular cancer.  Use sunscreen. Apply sunscreen liberally and repeatedly throughout the day. You should seek shade when your shadow is shorter than you. Protect yourself by wearing long sleeves, pants, a wide-brimmed hat, and sunglasses year round, whenever you are outdoors.  Once a month, do a whole-body skin exam, using a mirror to look at the skin on your back. Tell your health care provider about new moles, moles that have irregular borders, moles that are larger than a pencil eraser, or moles that have changed in shape or color.  Stay current with required vaccines (immunizations).  Influenza vaccine. All adults should be immunized every year.  Tetanus, diphtheria, and acellular pertussis (Td, Tdap) vaccine. An adult who has not previously received Tdap or who does not know his vaccine status should receive 1 dose of Tdap. This initial dose should be followed by tetanus and diphtheria toxoids (Td) booster doses every 10 years. Adults with an unknown or incomplete history of completing a 3-dose immunization series with Td-containing vaccines should begin or complete a primary immunization series including a Tdap dose. Adults should receive a Td booster every 10 years.  Varicella vaccine. An adult without evidence of immunity to varicella should receive 2 doses or a second dose if he has previously received 1 dose.  Human papillomavirus (HPV) vaccine. Males aged 68-21 years who have not received the vaccine previously should receive the 3-dose series. Males aged 22-26 years may be immunized. Immunization is recommended through the age of 6 years for any male who has sex with males and did not get any or all doses earlier. Immunization is recommended for any person with an immunocompromised condition through the age of 49 years if he did not get any or all doses earlier. During the 3-dose series, the second dose should be obtained 4-8 weeks after the first  dose. The third dose should be obtained 24 weeks after the first dose and 16 weeks after the second dose.  Zoster vaccine. One dose is recommended for adults aged 50 years or older unless certain conditions are present.  Measles, mumps, and rubella (MMR) vaccine. Adults born before [birth year] generally are considered immune to measles and mumps. Adults born in [birth year] or later should have 1 or more doses of MMR vaccine unless there is a contraindication to the vaccine or there is laboratory evidence of immunity to each of the three diseases. A routine second dose of MMR vaccine should be obtained at least 28 days after the first dose for students attending postsecondary  schools, health care workers, or international travelers. People who received inactivated measles vaccine or an unknown type of measles vaccine during 1963-1967 should receive 2 doses of MMR vaccine. People who received inactivated mumps vaccine or an unknown type of mumps vaccine before 1979 and are at high risk for mumps infection should consider immunization with 2 doses of MMR vaccine. Unvaccinated health care workers born before 1957 who lack laboratory evidence of measles, mumps, or rubella immunity or laboratory confirmation of disease should consider measles and mumps immunization with 2 doses of MMR vaccine or rubella immunization with 1 dose of MMR vaccine.  Pneumococcal 13-valent conjugate (PCV13) vaccine. When indicated, a person who is uncertain of his immunization history and has no record of immunization should receive the PCV13 vaccine. An adult aged 19 years or older who has certain medical conditions and has not been previously immunized should receive 1 dose of PCV13 vaccine. This PCV13 should be followed with a dose of pneumococcal polysaccharide (PPSV23) vaccine. The PPSV23 vaccine dose should be obtained at least 8 weeks after the dose of PCV13 vaccine. An adult aged 19 years or older who has certain medical conditions and  previously received 1 or more doses of PPSV23 vaccine should receive 1 dose of PCV13. The PCV13 vaccine dose should be obtained 1 or more years after the last PPSV23 vaccine dose.  Pneumococcal polysaccharide (PPSV23) vaccine. When PCV13 is also indicated, PCV13 should be obtained first. All adults aged 65 years and older should be immunized. An adult younger than age 65 years who has certain medical conditions should be immunized. Any person who resides in a nursing home or long-term care facility should be immunized. An adult smoker should be immunized. People with an immunocompromised condition and certain other conditions should receive both PCV13 and PPSV23 vaccines. People with human immunodeficiency virus (HIV) infection should be immunized as soon as possible after diagnosis. Immunization during chemotherapy or radiation therapy should be avoided. Routine use of PPSV23 vaccine is not recommended for American Indians, [place] Natives, or people younger than 65 years unless there are medical conditions that require PPSV23 vaccine. When indicated, people who have unknown immunization and have no record of immunization should receive PPSV23 vaccine. One-time revaccination 5 years after the first dose of PPSV23 is recommended for people aged 19-64 years who have chronic kidney failure, nephrotic syndrome, asplenia, or immunocompromised conditions. People who received 1-2 doses of PPSV23 before age 65 years should receive another dose of PPSV23 vaccine at age 65 years or later if at least 5 years have passed since the previous dose. Doses of PPSV23 are not needed for people immunized with PPSV23 at or after age 65 years.  Meningococcal vaccine. Adults with asplenia or persistent complement component deficiencies should receive 2 doses of quadrivalent meningococcal conjugate (MenACWY-D) vaccine. The doses should be obtained at least 2 months apart. Microbiologists working with certain meningococcal bacteria,  military recruits, people at risk during an outbreak, and people who travel to or live in countries with a high rate of meningitis should be immunized. A first-year college student up through age 21 years who is living in a residence hall should receive a dose if he did not receive a dose on or after his 16th birthday. Adults who have certain high-risk conditions should receive one or more doses of vaccine.  Hepatitis A vaccine. Adults who wish to be protected from this disease, have certain high-risk conditions, work with hepatitis A-infected animals, work in hepatitis A research labs, or   travel to or work in countries with a high rate of hepatitis A should be immunized. Adults who were previously unvaccinated and who anticipate close contact with an international adoptee during the first 60 days after arrival in the [place] from a country with a high rate of hepatitis A should be immunized.  Hepatitis B vaccine. Adults should be immunized if they wish to be protected from this disease, have certain high-risk conditions, may be exposed to blood or other infectious body fluids, are household contacts or sex partners of hepatitis B positive people, are clients or workers in certain care facilities, or travel to or work in countries with a high rate of hepatitis B.  Haemophilus influenzae type b (Hib) vaccine. A previously unvaccinated person with asplenia or sickle cell disease or having a scheduled splenectomy should receive 1 dose of Hib vaccine. Regardless of previous immunization, a recipient of a hematopoietic stem cell transplant should receive a 3-dose series 6-12 months after his successful transplant. Hib vaccine is not recommended for adults with HIV infection. Preventive Service / Frequency Ages 52 to 17  Blood pressure check.** / Every 1 to 2 years.  Lipid and cholesterol check.** / Every 5 years beginning at age 69.  Hepatitis C blood test.** / For any individual with known risks for  hepatitis C.  Skin self-exam. / Monthly.  Influenza vaccine. / Every year.  Tetanus, diphtheria, and acellular pertussis (Tdap, Td) vaccine.** / Consult your health care provider. 1 dose of Td every 10 years.  Varicella vaccine.** / Consult your health care provider.  HPV vaccine. / 3 doses over 6 months, if 72 or younger.  Measles, mumps, rubella (MMR) vaccine.** / You need at least 1 dose of MMR if you were born in 1957 or later. You may also need a second dose.  Pneumococcal 13-valent conjugate (PCV13) vaccine.** / Consult your health care provider.  Pneumococcal polysaccharide (PPSV23) vaccine.** / 1 to 2 doses if you smoke cigarettes or if you have certain conditions.  Meningococcal vaccine.** / 1 dose if you are age 35 to 60 years and a Market researcher living in a residence hall, or have one of several medical conditions. You may also need additional booster doses.  Hepatitis A vaccine.** / Consult your health care provider.  Hepatitis B vaccine.** / Consult your health care provider.  Haemophilus influenzae type b (Hib) vaccine.** / Consult your health care provider. Ages 35 to 8  Blood pressure check.** / Every 1 to 2 years.  Lipid and cholesterol check.** / Every 5 years beginning at age 57.  Lung cancer screening. / Every year if you are aged 44-80 years and have a 30-pack-year history of smoking and currently smoke or have quit within the past 15 years. Yearly screening is stopped once you have quit smoking for at least 15 years or develop a health problem that would prevent you from having lung cancer treatment.  Fecal occult blood test (FOBT) of stool. / Every year beginning at age 55 and continuing until age 73. You may not have to do this test if you get a colonoscopy every 10 years.  Flexible sigmoidoscopy** or colonoscopy.** / Every 5 years for a flexible sigmoidoscopy or every 10 years for a colonoscopy beginning at age 28 and continuing until age  1.  Hepatitis C blood test.** / For all people born from [birth year] through 1965 and any individual with known risks for hepatitis C.  Skin self-exam. / Monthly.  Influenza vaccine. / Every  year.  Tetanus, diphtheria, and acellular pertussis (Tdap/Td) vaccine.** / Consult your health care provider. 1 dose of Td every 10 years.  Varicella vaccine.** / Consult your health care provider.  Zoster vaccine.** / 1 dose for adults aged 53 years or older.  Measles, mumps, rubella (MMR) vaccine.** / You need at least 1 dose of MMR if you were born in 1957 or later. You may also need a second dose.  Pneumococcal 13-valent conjugate (PCV13) vaccine.** / Consult your health care provider.  Pneumococcal polysaccharide (PPSV23) vaccine.** / 1 to 2 doses if you smoke cigarettes or if you have certain conditions.  Meningococcal vaccine.** / Consult your health care provider.  Hepatitis A vaccine.** / Consult your health care provider.  Hepatitis B vaccine.** / Consult your health care provider.  Haemophilus influenzae type b (Hib) vaccine.** / Consult your health care provider. Ages 77 and over  Blood pressure check.** / Every 1 to 2 years.  Lipid and cholesterol check.**/ Every 5 years beginning at age 85.  Lung cancer screening. / Every year if you are aged 55-80 years and have a 30-pack-year history of smoking and currently smoke or have quit within the past 15 years. Yearly screening is stopped once you have quit smoking for at least 15 years or develop a health problem that would prevent you from having lung cancer treatment.  Fecal occult blood test (FOBT) of stool. / Every year beginning at age 33 and continuing until age 11. You may not have to do this test if you get a colonoscopy every 10 years.  Flexible sigmoidoscopy** or colonoscopy.** / Every 5 years for a flexible sigmoidoscopy or every 10 years for a colonoscopy beginning at age 28 and continuing until age 73.  Hepatitis C blood  test.** / For all people born from [birth year] through 1965 and any individual with known risks for hepatitis C.  Abdominal aortic aneurysm (AAA) screening.** / A one-time screening for ages 50 to 27 years who are current or former smokers.  Skin self-exam. / Monthly.  Influenza vaccine. / Every year.  Tetanus, diphtheria, and acellular pertussis (Tdap/Td) vaccine.** / 1 dose of Td every 10 years.  Varicella vaccine.** / Consult your health care provider.  Zoster vaccine.** / 1 dose for adults aged 34 years or older.  Pneumococcal 13-valent conjugate (PCV13) vaccine.** / Consult your health care provider.  Pneumococcal polysaccharide (PPSV23) vaccine.** / 1 dose for all adults aged 63 years and older.  Meningococcal vaccine.** / Consult your health care provider.  Hepatitis A vaccine.** / Consult your health care provider.  Hepatitis B vaccine.** / Consult your health care provider.  Haemophilus influenzae type b (Hib) vaccine.** / Consult your health care provider. **Family history and personal history of risk and conditions may change your health care provider's recommendations. Document Released: 11/07/2001 Document Revised: 09/16/2013 Document Reviewed: 02/06/2011 [facility] Patient Information 2015 [place], [place]. This information is not intended to replace advice given to you by your health care provider. Make sure you discuss any questions you have with your health care provider.

## 2014-06-12 NOTE — Progress Notes (Signed)
Pre visit review using our clinic review tool, if applicable. No additional management support is needed unless otherwise documented below in the visit note. 

## 2014-07-08 ENCOUNTER — Other Ambulatory Visit: Payer: Self-pay | Admitting: Family Medicine

## 2014-07-09 NOTE — Telephone Encounter (Signed)
Requesting Drysol 20% sol apply topically at bedtime. Last filled:06/12/14-4635ml Last OV:06/12/14 Please advise.//AB/CMA

## 2014-07-10 NOTE — Telephone Encounter (Signed)
Rx sent to the pharmacy by e-script.//AB/CMA 

## 2015-02-25 ENCOUNTER — Emergency Department (HOSPITAL_BASED_OUTPATIENT_CLINIC_OR_DEPARTMENT_OTHER): Payer: Worker's Compensation

## 2015-02-25 ENCOUNTER — Encounter (HOSPITAL_BASED_OUTPATIENT_CLINIC_OR_DEPARTMENT_OTHER): Payer: Self-pay | Admitting: *Deleted

## 2015-02-25 ENCOUNTER — Emergency Department (HOSPITAL_BASED_OUTPATIENT_CLINIC_OR_DEPARTMENT_OTHER)
Admission: EM | Admit: 2015-02-25 | Discharge: 2015-02-25 | Disposition: A | Discharge status: Home / Self Care | Payer: Worker's Compensation | Attending: Emergency Medicine | Admitting: Emergency Medicine

## 2015-02-25 DIAGNOSIS — R5383 Other fatigue: Secondary | ICD-10-CM | POA: Insufficient documentation

## 2015-02-25 DIAGNOSIS — L089 Local infection of the skin and subcutaneous tissue, unspecified: Secondary | ICD-10-CM | POA: Insufficient documentation

## 2015-02-25 DIAGNOSIS — R51 Headache: Secondary | ICD-10-CM | POA: Insufficient documentation

## 2015-02-25 DIAGNOSIS — S81852S Open bite, left lower leg, sequela: Secondary | ICD-10-CM | POA: Diagnosis present

## 2015-02-25 DIAGNOSIS — R509 Fever, unspecified: Secondary | ICD-10-CM | POA: Diagnosis not present

## 2015-02-25 DIAGNOSIS — Z8659 Personal history of other mental and behavioral disorders: Secondary | ICD-10-CM | POA: Insufficient documentation

## 2015-02-25 DIAGNOSIS — W540XXS Bitten by dog, sequela: Secondary | ICD-10-CM | POA: Diagnosis not present

## 2015-02-25 NOTE — ED Notes (Signed)
MD at bedside. 

## 2015-02-25 NOTE — ED Provider Notes (Signed)
CSN: [id number]     Arrival date & time 02/25/15  1857 History  This chart was scribed for Vanetta MuldersScott Rithy Mandley, MD by Elon SpannerGarrett Cook, ED Scribe. This patient was seen in room MHT13/MHT13 and the patient's care was started at 8:20 PM.   Chief Complaint  Patient presents with  . Animal Bite   The history is provided by the patient. No language interpreter was used.   HPI Comments: Gregory Day is a 19 y.o. 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  After the incident, the patient was seen at urgent care where the wound was cleaned with saline solution, he received a tetanus shot, and was prescribed augment which he started yesterday.  He reports coming to the ED today after experiencing increasing fatigue, redness, and swelling around the area of the wound as well as a subjective fever.  The patient also reports some slight facial and neck redness but denies any changes in sensation in these area.  He did not receive imaging yesterday.    The patient has the dog owner's phone number but the owner absconded with the dog to [place] and the dog jhas notquarantined for rabies currently.  Urgent Care followed up with animal control.     Past Medical History  Diagnosis Date  . ADHD (attention deficit hyperactivity disorder)    Past Surgical History  Procedure Laterality Date  . Tonsillectomy     Family History  Problem Relation Age of Onset  . IgA nephropathy    . Hypertension Father   . Hyperlipidemia Son   . Hypertension Maternal Grandmother   . Heart disease Maternal Grandfather   . Depression Paternal Grandmother    History  Substance Use Topics  . Smoking status: Never Smoker   . Smokeless tobacco: Not on file  . Alcohol Use: No    Review of Systems  Constitutional: Positive for fever and fatigue. Negative for chills.  HENT: Negative for rhinorrhea and sore throat.   Eyes: Negative for  visual disturbance.  Respiratory: Negative for cough and shortness of breath.   Cardiovascular: Positive for leg swelling. Negative for chest pain.  Gastrointestinal: Negative for nausea, vomiting, abdominal pain and diarrhea.  Genitourinary: Negative for dysuria and hematuria.  Musculoskeletal: Negative for back pain and neck pain.  Skin: Positive for color change, rash and wound.  Neurological: Positive for headaches.  Hematological: Does not bruise/bleed easily.  Psychiatric/Behavioral: Negative for confusion.      Allergies  Review of patient's allergies indicates no known allergies.  Home Medications   Prior to Admission medications   Not on File   BP 129/66 mmHg  Pulse 98  Temp(Src) 98.6 F (37 C) (Oral)  Resp 18  Ht 5\' 11"  (1.803 m)  Wt 170 lb (77.111 kg)  BMI 23.72 kg/m2  SpO2 98% Physical Exam  Constitutional: He is oriented to person, place, and time. He appears well-developed and well-nourished. No distress.  HENT:  Head: Normocephalic and atraumatic.  Mucous membranes moist.   Eyes: Conjunctivae and EOM are normal.  Pupils normal.  Eyes track normal.  Sclera clear.    Neck: Neck supple. No tracheal deviation present.  Cardiovascular: Normal rate and regular rhythm.   No murmur heard. Pulmonary/Chest: Effort normal. No respiratory distress.  Lungs clear bilaterally.   Abdominal: Bowel sounds are normal. There is no tenderness.  Musculoskeletal: Normal range of motion.  No swelling  in ankles.    Neurological: He is alert and oriented to person, place, and time. No cranial nerve deficit. He exhibits normal muscle tone. Coordination normal.  Skin: Skin is warm and dry.  4 teeth marks on lateral part mid portion of leg.  Redness is about 12 cm by 7 cm.  It has been outlined by a blue pen.  Slightly warm to touch.  No purulent discharge. DP 2+ on left foot.    Psychiatric: He has a normal mood and affect. His behavior is normal.  Nursing note and vitals  reviewed.   ED Course  Procedures (including critical care time)  DIAGNOSTIC STUDIES: Oxygen Saturation is 98% on RA, normal by my interpretation.    COORDINATION OF CARE:  8:28 PM Will order imaging of the area of the bite.  Mother and patient counseled to follow up with dog's owner to pursue quarantining to follow the dog for development of rab   Labs Review Labs Reviewed - No data to display  Imaging Review Dg Tibia/fibula Left  02/25/2015   CLINICAL DATA:  Dog bite to the left lower leg today with puncture wound laterally. No previous injury. Initial encounter.  EXAM: LEFT TIBIA AND FIBULA - 2 VIEW  COMPARISON:  None.  FINDINGS: Possible minimal subcutaneous emphysema laterally in the calf. No foreign body acute fracture, dislocation or bone destruction identified. The joint spaces are maintained.  IMPRESSION: No osseous abnormality or foreign body demonstrated. Possible minimal soft tissue emphysema laterally in the calf at site of puncture wound.   Electronically Signed   By: Carey Bullocks M.D.   On: 02/25/2015 21:41     EKG Interpretation None      MDM   Final diagnoses:  Dog bite of left lower leg with infection, sequela    Patient status post dog bite to his left leg yesterday. Seen in urgent care. Wound was washed out. Tetanus was updated. Patient was started on Augmentin. Set 2 doses of Augmentin. Today noticed redness around the puncture wounds from the dog bite. The bite occurred at a hotel where patient is employed. The animal has subsequently been taken to [place] where they live. Discussed with mother about talking to authorities to have the animal track down and make sure that the animal is at least quarantine at home and does not become sick. If the animal cannot be located then rabies vaccination and immunoglobulin is recommended. X-rays here tonight show no evidence of any bony injury. There is definitely development of cellulitis to that area. Recommend  continuing the Augmentin. Returning for any worsening of the infection at all.  I personally performed the services described in this documentation, which was scribed in my presence. The recorded information has been reviewed and is accurate.     Vanetta Mulders, MD 02/25/15 2204

## 2015-02-25 NOTE — ED Notes (Addendum)
Dog bite on left leg.  Pt was treated at a medical facility, received a tetanus shot.  Pt now noted to have streaking and swelling on his leg.  No bleeding noted.  Reports fever at home.  Pt has taken 2 doses of augmentin.

## 2015-02-25 NOTE — Discharge Instructions (Signed)
X-ray show no evidence of any of bone injury. You're showing evidence of developing cellulitis to the left leg at the site of a dog bite. Continue the Augmentin. Follow-up about the health and location of the animal is important as we discussed. Return for any new or worse symptoms. particularly return if the cellulitis is getting worse.

## 2016-08-08 IMAGING — CR DG TIBIA/FIBULA 2V*L*
4 series · 4 of 4 positions shown · non-contrast
Comparison: None.

CLINICAL DATA: Dog bite to the left lower leg today with puncture
wound laterally. No previous injury. Initial encounter.

EXAM:
LEFT TIBIA AND FIBULA - 2 VIEW

[t tib/fib ap left (1 of 2)]
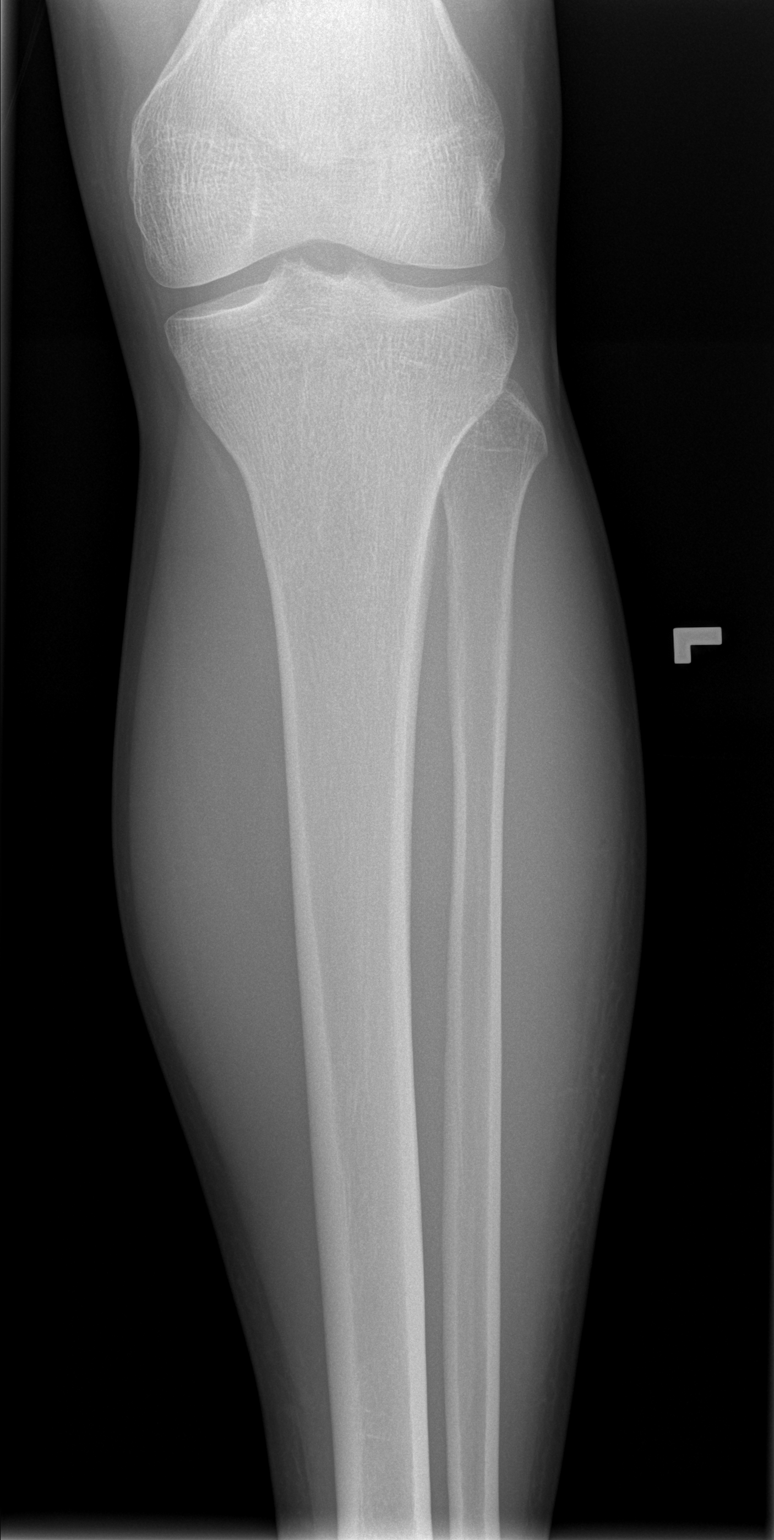

[t tib/fib ap left (2 of 2)]
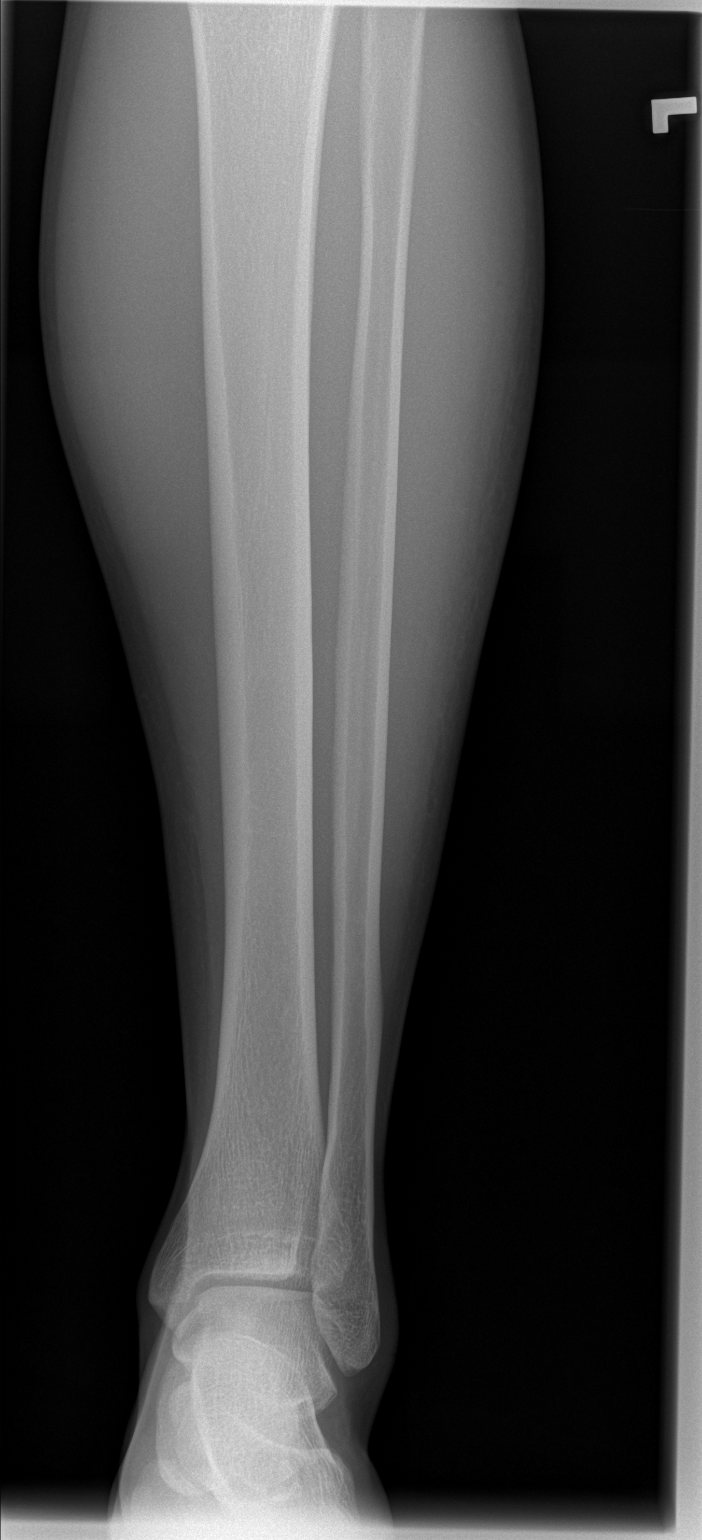

[t tib/fib lat left (1 of 2)]
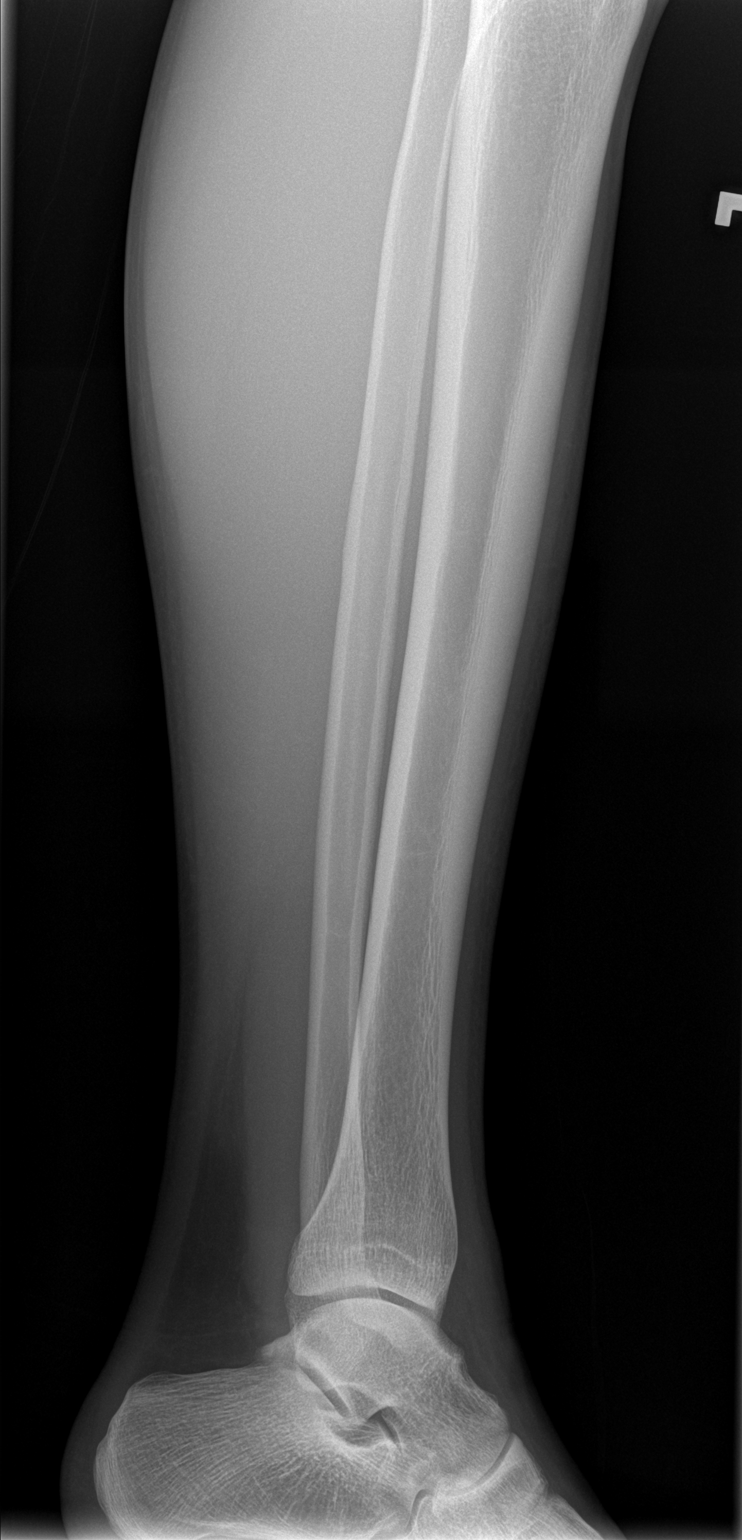

[t tib/fib lat left (2 of 2)]
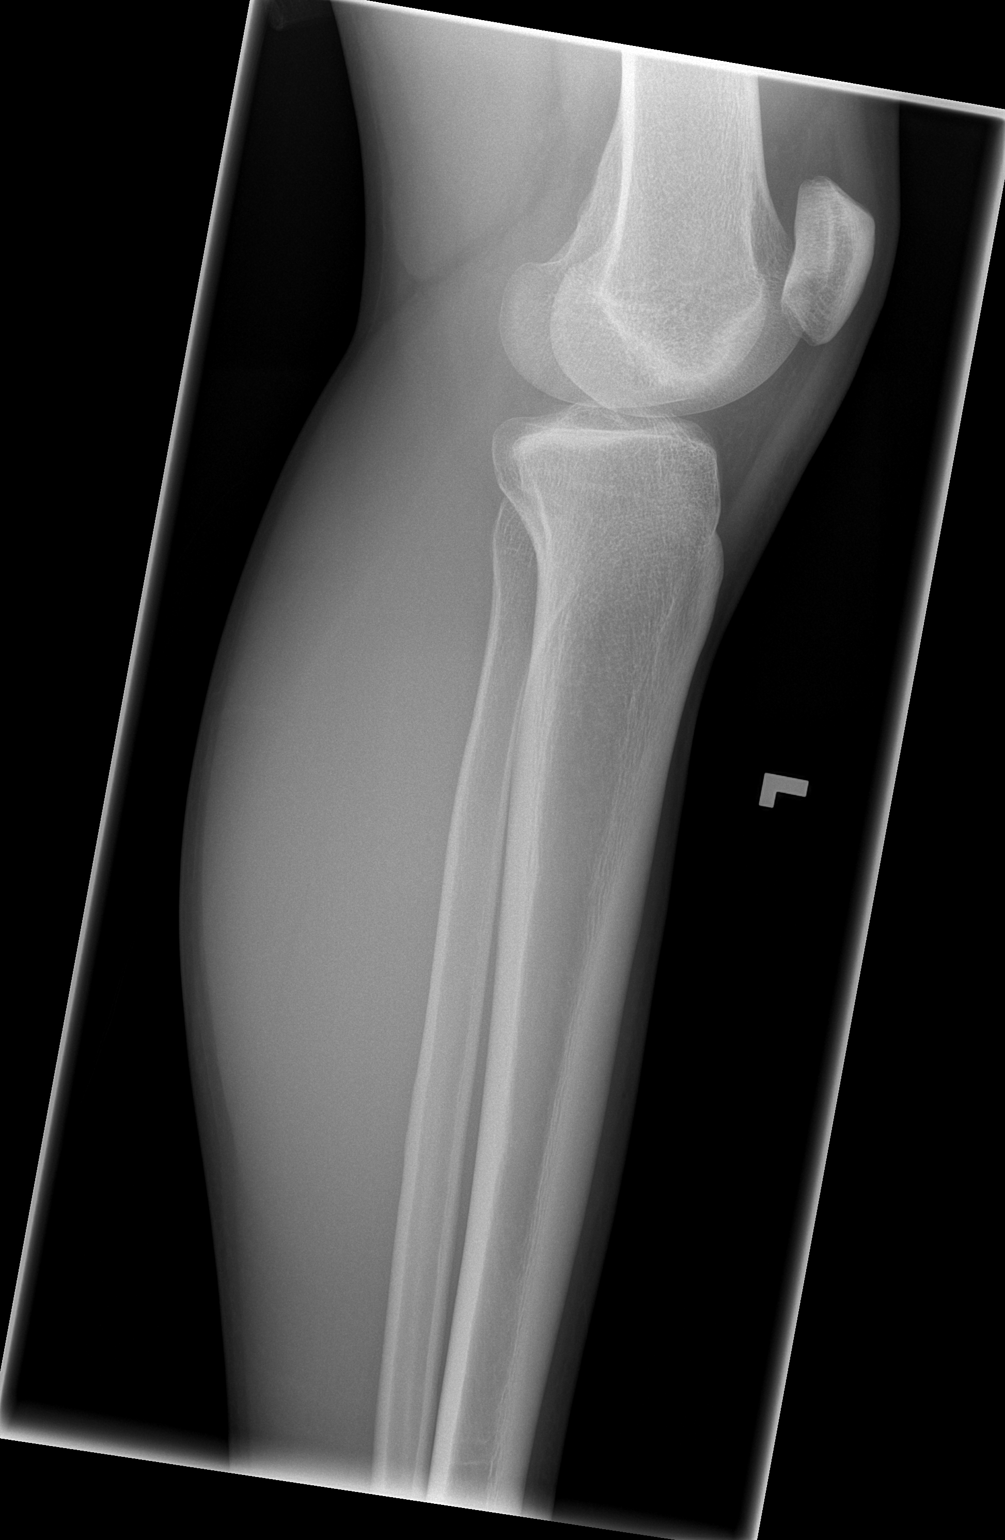

[4 of 4 positions shown; findings below may reference images not displayed]

FINDINGS: Possible minimal subcutaneous emphysema laterally in the calf. No
foreign body acute fracture, dislocation or bone destruction
identified. The joint spaces are maintained.
IMPRESSION: No osseous abnormality or foreign body demonstrated. Possible
minimal soft tissue emphysema laterally in the calf at site of
puncture wound.

## 2020-09-19 ENCOUNTER — Other Ambulatory Visit: Payer: Self-pay

## 2020-09-19 ENCOUNTER — Emergency Department (HOSPITAL_BASED_OUTPATIENT_CLINIC_OR_DEPARTMENT_OTHER)
Admission: EM | Admit: 2020-09-19 | Discharge: 2020-09-19 | Disposition: A | Discharge status: Home / Self Care | Payer: Self-pay | Attending: Emergency Medicine | Admitting: Emergency Medicine

## 2020-09-19 ENCOUNTER — Encounter (HOSPITAL_BASED_OUTPATIENT_CLINIC_OR_DEPARTMENT_OTHER): Payer: Self-pay | Admitting: Emergency Medicine

## 2020-09-19 DIAGNOSIS — W01198A Fall on same level from slipping, tripping and stumbling with subsequent striking against other object, initial encounter: Secondary | ICD-10-CM | POA: Insufficient documentation

## 2020-09-19 DIAGNOSIS — Z23 Encounter for immunization: Secondary | ICD-10-CM | POA: Insufficient documentation

## 2020-09-19 DIAGNOSIS — S0181XA Laceration without foreign body of other part of head, initial encounter: Secondary | ICD-10-CM | POA: Insufficient documentation

## 2020-09-19 MED ORDER — TETANUS-DIPHTH-ACELL PERTUSSIS 5-2.5-18.5 LF-MCG/0.5 IM SUSY
0.5000 mL | PREFILLED_SYRINGE | Freq: Once | INTRAMUSCULAR | Status: AC
  Administered 2020-09-19: 0.5 mL via INTRAMUSCULAR
  Filled 2020-09-19: qty 0.5

## 2020-09-19 NOTE — ED Provider Notes (Signed)
[facility] EMERGENCY DEPARTMENT Provider Note   CSN: [id number] Arrival date & time: 09/19/20  1131     History Chief Complaint  Patient presents with  . Fall    CASE Gregory Day is a 24 y.o. male.  HPI   Patient accidentally tripped and fell striking his face into a desk. Patient sustained a laceration to his right eyebrow as well as a laceration on his right cheek. Patient is not having any persistent bleeding. He did not lose consciousness. No jaw or mandible dislocation or discomfort.  Past Medical History:  Diagnosis Date  . ADHD (attention deficit hyperactivity disorder)     Patient Active Problem List   Diagnosis Date Noted  . SPERMATOCELE 11/01/2010  . WOUND, FINGER 10/07/2010  . ACUTE BRONCHITIS 04/06/2008  . ASPERGER'S DISORDER 05/09/2007  . ACNE NEC 05/09/2007  . ASPERGER'S DISORDER 02/28/2007  . ADHD 02/28/2007    Past Surgical History:  Procedure Laterality Date  . TONSILLECTOMY         Family History  Problem Relation Age of Onset  . IgA nephropathy Other   . Hypertension Father   . Hyperlipidemia Son   . Hypertension Maternal Grandmother   . Heart disease Maternal Grandfather   . Depression Paternal Grandmother     Social History   Tobacco Use  . Smoking status: Never Smoker  . Smokeless tobacco: Never Used  Vaping Use  . Vaping Use: Never used  Substance Use Topics  . Alcohol use: No  . Drug use: No    Home Medications Prior to Admission medications   Not on File    Allergies    Patient has no known allergies.  Review of Systems   Review of Systems  All other systems reviewed and are negative.   Physical Exam Updated Vital Signs BP 126/75 (BP Location: Left Arm)   Pulse 66   Temp 98.2 F (36.8 C) (Oral)   Resp 16   Ht 1.803 m (5\' 11" )   Wt 79.4 kg   SpO2 98%   BMI 24.41 kg/m   Physical Exam Vitals and nursing note reviewed.  Constitutional:      General: He is not in acute distress.    Appearance:  He is well-developed.  HENT:     Head: Normocephalic.     Comments: Superficial laceration below right eyebrow less than 1 cm in size, does not penetrate to the dermis, no eyelid involvement; laceration right cheek that appears to be a deep abrasion rather than the laceration, wound margins slightly separated but do not reapproximate with pressure    Right Ear: External ear normal.     Left Ear: External ear normal.  Eyes:     General: No scleral icterus.       Right eye: No discharge.        Left eye: No discharge.     Conjunctiva/sclera: Conjunctivae normal.  Neck:     Trachea: No tracheal deviation.  Cardiovascular:     Rate and Rhythm: Normal rate.  Pulmonary:     Effort: Pulmonary effort is normal. No respiratory distress.     Breath sounds: No stridor.  Abdominal:     General: There is no distension.  Musculoskeletal:        General: No swelling or deformity.     Cervical back: Neck supple.  Skin:    General: Skin is warm and dry.     Findings: No rash.  Neurological:     Mental Status:  He is alert.     Cranial Nerves: Cranial nerve deficit: no gross deficits.     ED Results / Procedures / Treatments   Labs (all labs ordered are listed, but only abnormal results are displayed) Labs Reviewed - No data to display  EKG None  Radiology No results found.  Procedures .Marland KitchenLaceration Repair  Date/Time: 09/19/2020 2:15 PM Performed by: Linwood Dibbles, MD Authorized by: Linwood Dibbles, MD   Consent:    Consent obtained:  Verbal   Consent given by:  Patient   Risks discussed:  Infection, need for additional repair, pain, poor cosmetic result and poor wound healing   Alternatives discussed:  No treatment and delayed treatment Universal protocol:    Procedure explained and questions answered to patient or proxy's satisfaction: yes     Relevant documents present and verified: yes     Test results available: yes     Imaging studies available: yes     Required blood products,  implants, devices, and special equipment available: yes     Site/side marked: yes     Immediately prior to procedure, a time out was called: yes     Patient identity confirmed:  Verbally with patient Anesthesia:    Anesthesia method:  Local infiltration Laceration details:    Length (cm):  2 Exploration:    Wound extent: no areolar tissue violation noted, no fascia violation noted, no foreign bodies/material noted, no muscle damage noted, no nerve damage noted, no tendon damage noted, no underlying fracture noted and no vascular damage noted     Contaminated: no   Treatment:    Area cleansed with:  Saline   Debridement:  None Skin repair:    Repair method:  Tissue adhesive Repair type:    Repair type:  Simple Post-procedure details:    Dressing:  Open (no dressing)   Procedure completion:  Tolerated well, no immediate complications   (including critical care time)  Medications Ordered in ED Medications  Tdap (BOOSTRIX) injection 0.5 mL (0.5 mLs Intramuscular Given 09/19/20 1317)    ED Course  I have reviewed the triage vital signs and the nursing notes.  Pertinent labs & imaging results that were available during my care of the patient were reviewed by me and considered in my medical decision making (see chart for details).    MDM Rules/Calculators/A&P                         Patient presented with 3 facial lacerations.  They are superficial in nature.  The one on the cheek is a little bit deeper but it actually appears to be more of a deeper abrasion.  Dermabond applied Final Clinical Impression(s) / ED Diagnoses Final diagnoses:  Facial laceration, initial encounter    Rx / DC Orders ED Discharge Orders    None       Linwood Dibbles, MD 09/19/20 1416

## 2020-09-19 NOTE — Discharge Instructions (Addendum)
Avoid putting antibiotic ointment on the Dermabond glue.  It should peel off on its own within a week or 2.  Do apply antibiotic ointment to the eyebrow

## 2020-09-19 NOTE — ED Notes (Signed)
Dermabond at bedside for MD for lac repair

## 2020-09-19 NOTE — ED Notes (Signed)
ED Provider at bedside. 

## 2020-09-19 NOTE — ED Triage Notes (Signed)
Pt c/o trip and fall hitting head into desk. Pt presents with laceration to right eye brow and right cheek area, bleeding controlled.

## 2020-09-19 NOTE — ED Notes (Signed)
ED Provider at bedside for lac repair
